# Patient Record
Sex: Male | Born: 1980 | Race: White | Hispanic: No | Marital: Single | Smoking: Current every day smoker
Health system: Southern US, Community
[De-identification: ages and names within clinical notes are randomized; demographics above are authoritative.]

## PROBLEM LIST (undated history)

## (undated) HISTORY — PX: LEG SURGERY: SHX1003

---

## 2001-07-11 ENCOUNTER — Emergency Department (HOSPITAL_COMMUNITY): Admission: EM | Admit: 2001-07-11 | Discharge: 2001-07-11 | Payer: Self-pay | Admitting: Emergency Medicine

## 2001-09-10 ENCOUNTER — Encounter: Admission: RE | Admit: 2001-09-10 | Discharge: 2001-10-26 | Payer: Self-pay | Admitting: Orthopedic Surgery

## 2013-12-02 ENCOUNTER — Encounter (HOSPITAL_BASED_OUTPATIENT_CLINIC_OR_DEPARTMENT_OTHER): Payer: Self-pay | Admitting: *Deleted

## 2013-12-02 ENCOUNTER — Emergency Department (HOSPITAL_BASED_OUTPATIENT_CLINIC_OR_DEPARTMENT_OTHER)
Admission: EM | Admit: 2013-12-02 | Discharge: 2013-12-02 | Disposition: A | Discharge status: Home / Self Care | Payer: Worker's Compensation | Attending: Emergency Medicine | Admitting: Emergency Medicine

## 2013-12-02 ENCOUNTER — Emergency Department (HOSPITAL_BASED_OUTPATIENT_CLINIC_OR_DEPARTMENT_OTHER): Payer: Worker's Compensation

## 2013-12-02 DIAGNOSIS — Y990 Civilian activity done for income or pay: Secondary | ICD-10-CM | POA: Diagnosis not present

## 2013-12-02 DIAGNOSIS — Z720 Tobacco use: Secondary | ICD-10-CM | POA: Insufficient documentation

## 2013-12-02 DIAGNOSIS — Z23 Encounter for immunization: Secondary | ICD-10-CM | POA: Insufficient documentation

## 2013-12-02 DIAGNOSIS — Y9289 Other specified places as the place of occurrence of the external cause: Secondary | ICD-10-CM | POA: Insufficient documentation

## 2013-12-02 DIAGNOSIS — S61012A Laceration without foreign body of left thumb without damage to nail, initial encounter: Secondary | ICD-10-CM | POA: Insufficient documentation

## 2013-12-02 DIAGNOSIS — Y9389 Activity, other specified: Secondary | ICD-10-CM | POA: Diagnosis not present

## 2013-12-02 DIAGNOSIS — W260XXA Contact with knife, initial encounter: Secondary | ICD-10-CM | POA: Insufficient documentation

## 2013-12-02 DIAGNOSIS — IMO0002 Reserved for concepts with insufficient information to code with codable children: Secondary | ICD-10-CM

## 2013-12-02 MED ORDER — HYDROCODONE-ACETAMINOPHEN 5-325 MG PO TABS: 1.0000 | ORAL_TABLET | ORAL | Status: AC | PRN

## 2013-12-02 MED ORDER — LIDOCAINE HCL (PF) 1 % IJ SOLN
10.0000 mL | Freq: Once | INTRAMUSCULAR | Status: AC
  Administered 2013-12-02: 10 mL
  Filled 2013-12-02: qty 10

## 2013-12-02 MED ORDER — TETANUS-DIPHTH-ACELL PERTUSSIS 5-2.5-18.5 LF-MCG/0.5 IM SUSP
0.5000 mL | Freq: Once | INTRAMUSCULAR | Status: AC
  Administered 2013-12-02: 0.5 mL via INTRAMUSCULAR
  Filled 2013-12-02: qty 0.5

## 2013-12-02 MED ORDER — OXYCODONE-ACETAMINOPHEN 5-325 MG PO TABS
2.0000 | ORAL_TABLET | Freq: Once | ORAL | Status: AC
  Administered 2013-12-02: 2 via ORAL
  Filled 2013-12-02: qty 2

## 2013-12-02 NOTE — ED Notes (Signed)
Left thumb laceration with a knife at work. Bleeding controlled.

## 2013-12-02 NOTE — ED Notes (Signed)
PA at bedside suturing.

## 2013-12-02 NOTE — ED Provider Notes (Signed)
CSN: [id number]     Arrival date & time 12/02/13  2015 History   First MD Initiated Contact with Patient 12/02/13 2026     Chief Complaint  Patient presents with  . Laceration     (Consider location/radiation/quality/duration/timing/severity/associated sxs/prior Treatment) HPI Comments: This is a 33 year old male who presents to the emergency department complaining of a laceration to his left thumb occurring just prior to arrival. Patient reports he was at work and accidentally cut his thumb with a knife. Unknown last tetanus shot, he believes it was many years ago. States he is experiencing a throbbing pain, worse when he tries to move his thumb. States his thumb is starting to feel numb at the tip.  Patient is a 33 y.o. male presenting with skin laceration. The history is provided by the patient.  Laceration   History reviewed. No pertinent past medical history. Past Surgical History  Procedure Laterality Date  . Leg surgery     No family history on file. History  Substance Use Topics  . Smoking status: Current Every Day Smoker -- 1.00 packs/day    Types: Cigarettes  . Smokeless tobacco: Not on file  . Alcohol Use: No    Review of Systems  Constitutional: Negative.   Respiratory: Negative.   Cardiovascular: Negative.   Gastrointestinal: Negative for nausea.  Musculoskeletal:       + L thumb pain.  Skin: Positive for wound.  Neurological: Positive for numbness. Negative for syncope.      Allergies  Review of patient's allergies indicates no known allergies.  Home Medications   Prior to Admission medications   Medication Sig Start Date End Date Taking? Authorizing Provider  HYDROcodone-acetaminophen (NORCO/VICODIN) 5-325 MG per tablet Take 1-2 tablets by mouth every 4 (four) hours as needed. 12/02/13   Fadil Macmaster M Kanna Dafoe, PA-C   BP 122/70 mmHg  Pulse 73  Temp(Src) 98 F (36.7 C) (Oral)  Resp 18  Ht 5\' 8"  (1.727 m)  Wt 145 lb (65.772 kg)  BMI 22.05 kg/m2  SpO2  98% Physical Exam  Constitutional: He is oriented to person, place, and time. He appears well-developed and well-nourished. No distress.  HENT:  Head: Normocephalic and atraumatic.  Eyes: Conjunctivae and EOM are normal.  Neck: Normal range of motion. Neck supple.  Cardiovascular: Normal rate, regular rhythm and normal heart sounds.   Pulmonary/Chest: Effort normal and breath sounds normal.  Musculoskeletal: Normal range of motion. He exhibits no edema.  1 cm curved laceration at the distal tip of left thumb, appears to be deep. No visible bone. Laceration is through nail. Full flexion and extension of left thumb, full flexion and extension at DIP and MCP. No evidence of tendon disruption. Cap refill less than 3 seconds. Sensation intact.  Neurological: He is alert and oriented to person, place, and time.  Skin: Skin is warm and dry.  Psychiatric: He has a normal mood and affect. His behavior is normal.  Nursing note and vitals reviewed.   ED Course  NERVE BLOCK Date/Time: 12/02/2013 9:22 PM Performed by: Kathrynn SpeedHESS, Trueman Worlds M Authorized by: Kathrynn SpeedHESS, Alaija Ruble M Consent: Verbal consent obtained. Risks and benefits: risks, benefits and alternatives were discussed Consent given by: patient Patient understanding: patient states understanding of the procedure being performed Patient identity confirmed: verbally with patient Indications: pain relief Body area: upper extremity Nerve: digital Laterality: left Preparation: Patient was prepped and draped in the usual sterile fashion. Needle gauge: 25 G Location technique: anatomical landmarks Local anesthetic: lidocaine 1% without epinephrine Anesthetic  total: 3 ml Outcome: pain improved Patient tolerance: Patient tolerated the procedure well with no immediate complications  LACERATION REPAIR Performed by: Geralynn RileHess, Jazmynn Pho, Carys Roberts, PA student Authorized by: Celene SkeenHess, Harding Thomure Consent: Verbal consent obtained. Risks and benefits: risks, benefits and  alternatives were discussed Consent given by: patient Patient identity confirmed: provided demographic data Prepped and Draped in normal sterile fashion Wound explored  Laceration Location: left thumb  Laceration Length: 1cm  No Foreign Bodies seen or palpated  Anesthesia: digital block  Local anesthetic: lidocaine 1% without epinephrine  Anesthetic total: 3 ml  Irrigation method: syringe Amount of cleaning: standard  Skin closure: 5-0 prolene  Number of sutures: 3  Technique: simple interrupted  Patient tolerance: Patient tolerated the procedure well with no immediate complications.   (including critical care time) Labs Review Labs Reviewed - No data to display  Imaging Review Dg Finger Thumb Left  12/02/2013   CLINICAL DATA:  Laceration to the tip of the left thumb  EXAM: LEFT THUMB 2+V  COMPARISON:  None.  FINDINGS: There is no evidence of fracture or dislocation. There is no evidence of arthropathy or other focal bone abnormality. There is a soft tissue laceration of the tip of the left thumb.  IMPRESSION: Soft tissue laceration at the tip of the left thumb without osseous involvement.   Electronically Signed   By: Elige KoHetal  Patel   On: 12/02/2013 20:49     EKG Interpretation None      MDM   Final diagnoses:  Laceration  Thumb laceration, left, initial encounter   Pt with thumb laceration. Neurovascularly intact. Concern for involvement of bone. Xray without osseous involvement. No evidence of tendon disruption. Wound care given. Tetanus updated. Laceration repaired. Stable for d/c. Return precautions given. Patient states understanding of treatment care plan and is agreeable.  Kathrynn SpeedRobyn M Shakedra Beam, PA-C 12/02/13 2147  Enid SkeensJoshua M Zavitz, MD 12/03/13 [phone]

## 2013-12-02 NOTE — ED Notes (Signed)
Wound cleaned no bleeding at this time.

## 2013-12-02 NOTE — Discharge Instructions (Signed)
Follow-up with your primary care physician or an urgent care in 7-10 days for suture removal.  Laceration Care, Adult A laceration is a cut or lesion that goes through all layers of the skin and into the tissue just beneath the skin. TREATMENT  Some lacerations may not require closure. Some lacerations may not be able to be closed due to an increased risk of infection. It is important to see your caregiver as soon as possible after an injury to minimize the risk of infection and maximize the opportunity for successful closure. If closure is appropriate, pain medicines may be given, if needed. The wound will be cleaned to help prevent infection. Your caregiver will use stitches (sutures), staples, wound glue (adhesive), or skin adhesive strips to repair the laceration. These tools bring the skin edges together to allow for faster healing and a better cosmetic outcome. However, all wounds will heal with a scar. Once the wound has healed, scarring can be minimized by covering the wound with sunscreen during the day for 1 full year. HOME CARE INSTRUCTIONS  For sutures or staples:  Keep the wound clean and dry.  If you were given a bandage (dressing), you should change it at least once a day. Also, change the dressing if it becomes wet or dirty, or as directed by your caregiver.  Wash the wound with soap and water 2 times a day. Rinse the wound off with water to remove all soap. Pat the wound dry with a clean towel.  After cleaning, apply a thin layer of the antibiotic ointment as recommended by your caregiver. This will help prevent infection and keep the dressing from sticking.  You may shower as usual after the first 24 hours. Do not soak the wound in water until the sutures are removed.  Only take over-the-counter or prescription medicines for pain, discomfort, or fever as directed by your caregiver.  Get your sutures or staples removed as directed by your caregiver. For skin adhesive  strips:  Keep the wound clean and dry.  Do not get the skin adhesive strips wet. You may bathe carefully, using caution to keep the wound dry.  If the wound gets wet, pat it dry with a clean towel.  Skin adhesive strips will fall off on their own. You may trim the strips as the wound heals. Do not remove skin adhesive strips that are still stuck to the wound. They will fall off in time. For wound adhesive:  You may briefly wet your wound in the shower or bath. Do not soak or scrub the wound. Do not swim. Avoid periods of heavy perspiration until the skin adhesive has fallen off on its own. After showering or bathing, gently pat the wound dry with a clean towel.  Do not apply liquid medicine, cream medicine, or ointment medicine to your wound while the skin adhesive is in place. This may loosen the film before your wound is healed.  If a dressing is placed over the wound, be careful not to apply tape directly over the skin adhesive. This may cause the adhesive to be pulled off before the wound is healed.  Avoid prolonged exposure to sunlight or tanning lamps while the skin adhesive is in place. Exposure to ultraviolet light in the first year will darken the scar.  The skin adhesive will usually remain in place for 5 to 10 days, then naturally fall off the skin. Do not pick at the adhesive film. You may need a tetanus shot if:  You cannot remember when you had your last tetanus shot.  You have never had a tetanus shot. If you get a tetanus shot, your arm may swell, get red, and feel warm to the touch. This is common and not a problem. If you need a tetanus shot and you choose not to have one, there is a rare chance of getting tetanus. Sickness from tetanus can be serious. SEEK MEDICAL CARE IF:   You have redness, swelling, or increasing pain in the wound.  You see a red line that goes away from the wound.  You have yellowish-white fluid (pus) coming from the wound.  You have a  fever.  You notice a bad smell coming from the wound or dressing.  Your wound breaks open before or after sutures have been removed.  You notice something coming out of the wound such as wood or glass.  Your wound is on your hand or foot and you cannot move a finger or toe. SEEK IMMEDIATE MEDICAL CARE IF:   Your pain is not controlled with prescribed medicine.  You have severe swelling around the wound causing pain and numbness or a change in color in your arm, hand, leg, or foot.  Your wound splits open and starts bleeding.  You have worsening numbness, weakness, or loss of function of any joint around or beyond the wound.  You develop painful lumps near the wound or on the skin anywhere on your body. MAKE SURE YOU:   Understand these instructions.  Will watch your condition.  Will get help right away if you are not doing well or get worse. Document Released: 01/09/2005 Document Revised: 04/03/2011 Document Reviewed: 07/05/2010 [facility] Patient Information 2015 [place], [place]. This information is not intended to replace advice given to you by your health care provider. Make sure you discuss any questions you have with your health care provider.

## 2015-06-07 IMAGING — CR DG FINGER THUMB 2+V*L*
3 series · 3 of 3 positions shown · non-contrast
Comparison: None.

CLINICAL DATA: Laceration to the tip of the left thumb

EXAM:
LEFT THUMB 2+V

[x finger pa left (1 of 2)]
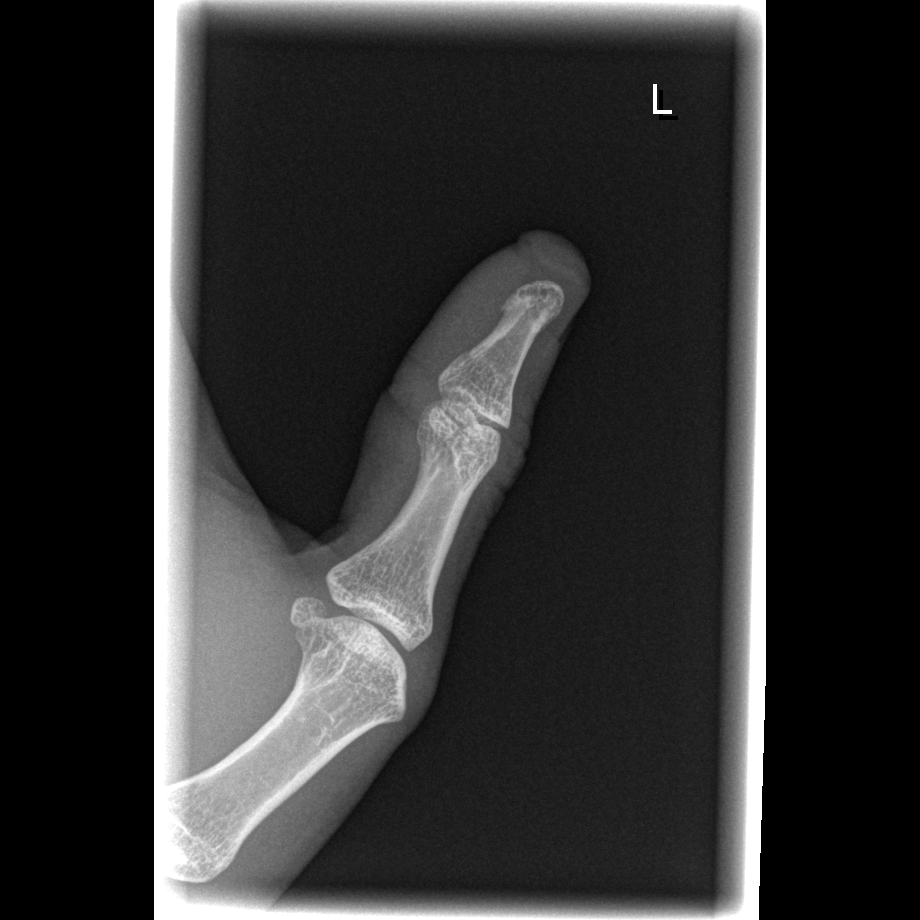

[x finger obl. left]
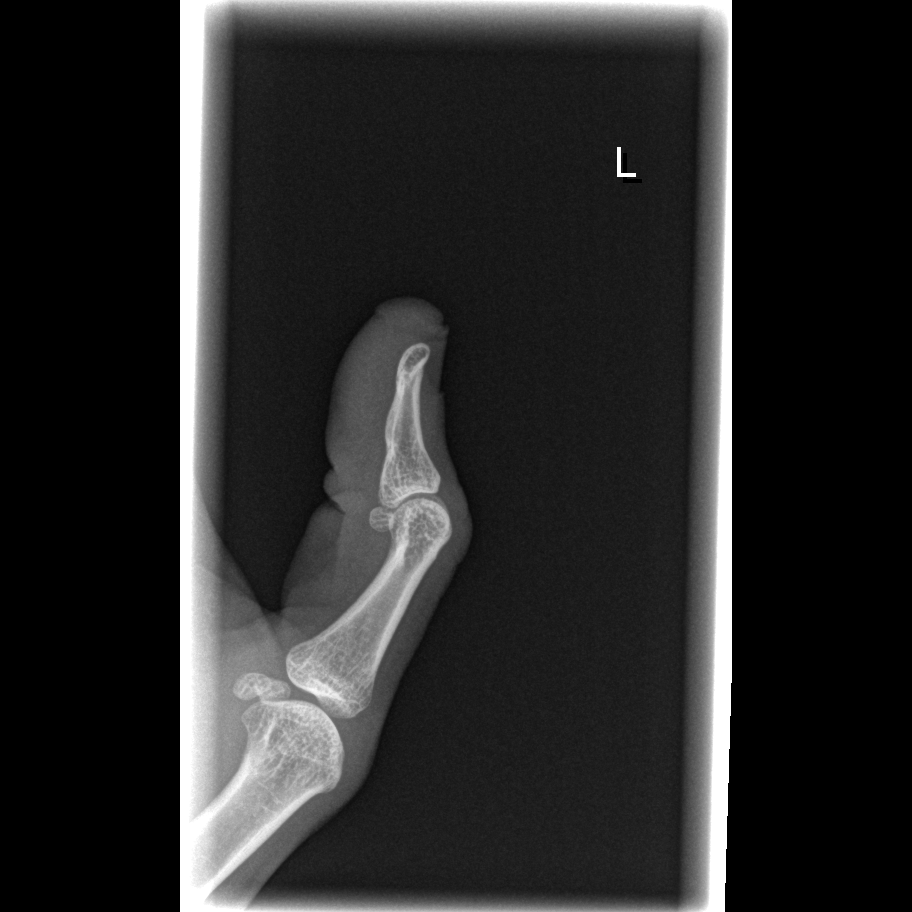

[x finger pa left (2 of 2)]
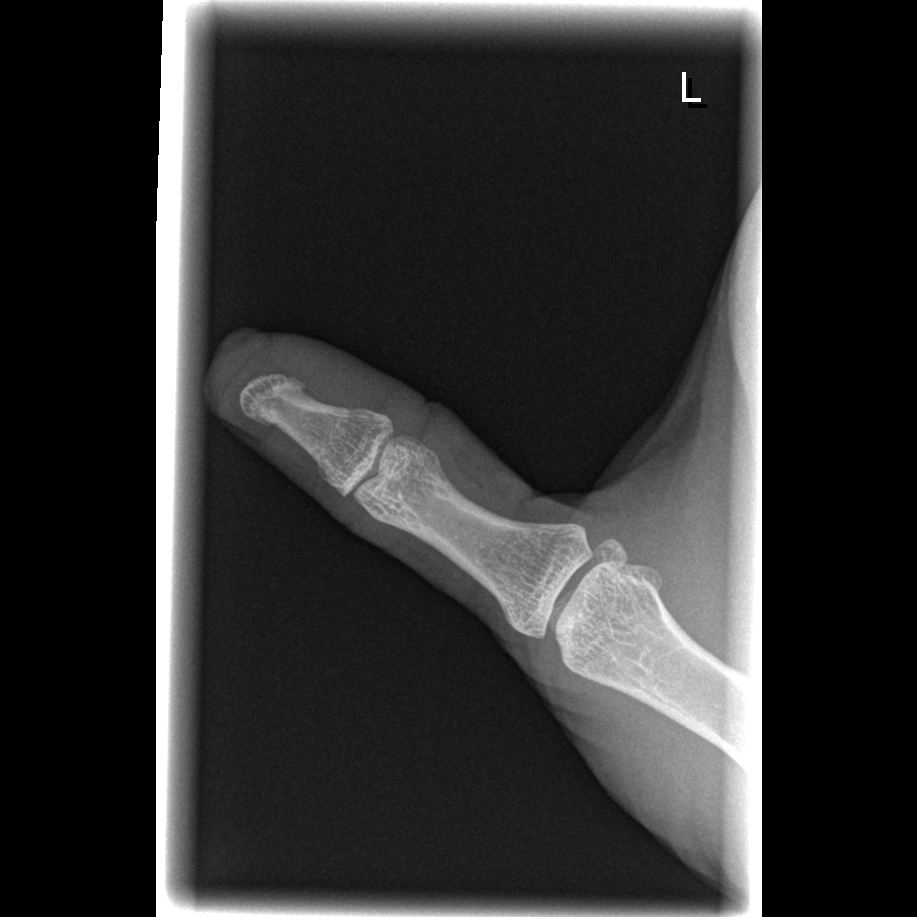

[3 of 3 positions shown; findings below may reference images not displayed]

FINDINGS: There is no evidence of fracture or dislocation. There is no
evidence of arthropathy or other focal bone abnormality. There is a
soft tissue laceration of the tip of the left thumb.
IMPRESSION: Soft tissue laceration at the tip of the left thumb without osseous
involvement.
